# Patient Record
Sex: Female | Born: 1960 | Race: White | Hispanic: No | Marital: Married | Smoking: Never smoker
Health system: Southern US, Community
[De-identification: ages and names within clinical notes are randomized; demographics above are authoritative.]

## PROBLEM LIST (undated history)

## (undated) DIAGNOSIS — K5790 Diverticulosis of intestine, part unspecified, without perforation or abscess without bleeding: Secondary | ICD-10-CM

## (undated) DIAGNOSIS — A389 Scarlet fever, uncomplicated: Secondary | ICD-10-CM

## (undated) DIAGNOSIS — R5383 Other fatigue: Secondary | ICD-10-CM

## (undated) DIAGNOSIS — F419 Anxiety disorder, unspecified: Secondary | ICD-10-CM

## (undated) DIAGNOSIS — I10 Essential (primary) hypertension: Secondary | ICD-10-CM

## (undated) DIAGNOSIS — E669 Obesity, unspecified: Secondary | ICD-10-CM

## (undated) DIAGNOSIS — H409 Unspecified glaucoma: Secondary | ICD-10-CM

## (undated) DIAGNOSIS — G039 Meningitis, unspecified: Secondary | ICD-10-CM

## (undated) DIAGNOSIS — G43909 Migraine, unspecified, not intractable, without status migrainosus: Secondary | ICD-10-CM

## (undated) DIAGNOSIS — N200 Calculus of kidney: Secondary | ICD-10-CM

## (undated) DIAGNOSIS — N390 Urinary tract infection, site not specified: Secondary | ICD-10-CM

## (undated) DIAGNOSIS — K5792 Diverticulitis of intestine, part unspecified, without perforation or abscess without bleeding: Secondary | ICD-10-CM

## (undated) DIAGNOSIS — F329 Major depressive disorder, single episode, unspecified: Secondary | ICD-10-CM

## (undated) DIAGNOSIS — F32A Depression, unspecified: Secondary | ICD-10-CM

## (undated) HISTORY — PX: TONSILLECTOMY: SUR1361

## (undated) HISTORY — PX: ABDOMINAL HYSTERECTOMY: SHX81

## (undated) HISTORY — DX: Obesity, unspecified: E66.9

## (undated) HISTORY — DX: Migraine, unspecified, not intractable, without status migrainosus: G43.909

## (undated) HISTORY — DX: Diverticulitis of intestine, part unspecified, without perforation or abscess without bleeding: K57.92

## (undated) HISTORY — DX: Other fatigue: R53.83

## (undated) HISTORY — DX: Calculus of kidney: N20.0

## (undated) HISTORY — DX: Diverticulosis of intestine, part unspecified, without perforation or abscess without bleeding: K57.90

## (undated) HISTORY — DX: Urinary tract infection, site not specified: N39.0

## (undated) HISTORY — DX: Scarlet fever, uncomplicated: A38.9

## (undated) HISTORY — DX: Depression, unspecified: F32.A

## (undated) HISTORY — DX: Anxiety disorder, unspecified: F41.9

## (undated) HISTORY — DX: Meningitis, unspecified: G03.9

## (undated) HISTORY — DX: Unspecified glaucoma: H40.9

## (undated) HISTORY — PX: EYE SURGERY: SHX253

---
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
## 2018-04-22 ENCOUNTER — Emergency Department (HOSPITAL_COMMUNITY)
Admission: EM | Admit: 2018-04-22 | Discharge: 2018-04-22 | Disposition: A | Discharge status: Home / Self Care | Payer: No Typology Code available for payment source | Attending: Emergency Medicine | Admitting: Emergency Medicine

## 2018-04-22 ENCOUNTER — Other Ambulatory Visit: Payer: Self-pay

## 2018-04-22 ENCOUNTER — Encounter (HOSPITAL_COMMUNITY): Payer: Self-pay | Admitting: Emergency Medicine

## 2018-04-22 ENCOUNTER — Emergency Department (HOSPITAL_COMMUNITY): Payer: No Typology Code available for payment source

## 2018-04-22 DIAGNOSIS — I10 Essential (primary) hypertension: Secondary | ICD-10-CM | POA: Diagnosis not present

## 2018-04-22 DIAGNOSIS — Y92410 Unspecified street and highway as the place of occurrence of the external cause: Secondary | ICD-10-CM | POA: Insufficient documentation

## 2018-04-22 DIAGNOSIS — Y939 Activity, unspecified: Secondary | ICD-10-CM | POA: Diagnosis not present

## 2018-04-22 DIAGNOSIS — R079 Chest pain, unspecified: Secondary | ICD-10-CM | POA: Insufficient documentation

## 2018-04-22 DIAGNOSIS — R1012 Left upper quadrant pain: Secondary | ICD-10-CM | POA: Diagnosis not present

## 2018-04-22 DIAGNOSIS — Y999 Unspecified external cause status: Secondary | ICD-10-CM | POA: Insufficient documentation

## 2018-04-22 HISTORY — DX: Essential (primary) hypertension: I10

## 2018-04-22 LAB — I-STAT CREATININE, ED: Creatinine, Ser: 0.8 mg/dL (ref 0.44–1.00)

## 2018-04-22 MED ORDER — ONDANSETRON 4 MG PO TBDP: 4.0000 mg | ORAL_TABLET | Freq: Three times a day (TID) | ORAL | 0 refills | Status: DC | PRN

## 2018-04-22 MED ORDER — ONDANSETRON 4 MG PO TBDP
4.0000 mg | ORAL_TABLET | Freq: Once | ORAL | Status: AC
  Administered 2018-04-22: 4 mg via ORAL
  Filled 2018-04-22: qty 1

## 2018-04-22 MED ORDER — ONDANSETRON HCL 4 MG/2ML IJ SOLN
4.0000 mg | Freq: Once | INTRAMUSCULAR | Status: AC
  Administered 2018-04-22: 4 mg via INTRAVENOUS
  Filled 2018-04-22: qty 2

## 2018-04-22 MED ORDER — MORPHINE SULFATE (PF) 4 MG/ML IV SOLN
4.0000 mg | Freq: Once | INTRAVENOUS | Status: AC
  Administered 2018-04-22: 4 mg via INTRAVENOUS
  Filled 2018-04-22: qty 1

## 2018-04-22 MED ORDER — METHOCARBAMOL 500 MG PO TABS: 500.0000 mg | ORAL_TABLET | Freq: Two times a day (BID) | ORAL | 0 refills | Status: DC | PRN

## 2018-04-22 MED ORDER — IOHEXOL 300 MG/ML  SOLN
100.0000 mL | Freq: Once | INTRAMUSCULAR | Status: AC | PRN
  Administered 2018-04-22: 100 mL via INTRAVENOUS
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
## 2018-04-22 NOTE — ED Notes (Signed)
Pt transported to CT ?
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
## 2018-11-20 ENCOUNTER — Encounter: Payer: Self-pay | Admitting: Gastroenterology

## 2018-12-02 ENCOUNTER — Encounter: Payer: Self-pay | Admitting: Gastroenterology

## 2018-12-14 ENCOUNTER — Other Ambulatory Visit: Payer: Self-pay

## 2018-12-14 ENCOUNTER — Encounter: Payer: Self-pay | Admitting: Gastroenterology

## 2018-12-14 ENCOUNTER — Ambulatory Visit (INDEPENDENT_AMBULATORY_CARE_PROVIDER_SITE_OTHER): Payer: BC Managed Care – PPO | Admitting: Gastroenterology

## 2018-12-14 VITALS — BP 126/88 | HR 81 | Temp 98.2°F | Ht 66.0 in | Wt 228.1 lb

## 2018-12-14 DIAGNOSIS — Z1211 Encounter for screening for malignant neoplasm of colon: Secondary | ICD-10-CM | POA: Diagnosis not present

## 2018-12-14 DIAGNOSIS — Z8719 Personal history of other diseases of the digestive system: Secondary | ICD-10-CM

## 2018-12-14 NOTE — Patient Instructions (Signed)
If you are age 57 or older, your body mass index should be between 23-30. Your Body mass index is 36.82 kg/m. If this is out of the aforementioned range listed, please consider follow up with your Primary Care Provider.  If you are age 32 or younger, your body mass index should be between 19-25. Your Body mass index is 36.82 kg/m. If this is out of the aformentioned range listed, please consider follow up with your Primary Care Provider.   You have been scheduled for a colonoscopy. Please follow written instructions given to you at your visit today.  Please pick up your prep supplies at the pharmacy within the next 1-3 days. If you use inhalers (even only as needed), please bring them with you on the day of your procedure. Your physician has requested that you go to www.startemmi.com and enter the access code given to you at your visit today. This web site gives a general overview about your procedure. However, you should still follow specific instructions given to you by our office regarding your preparation for the procedure.  Thank you,  Dr. Jackquline Denmark

## 2018-12-14 NOTE — Progress Notes (Signed)
Chief Complaint: for colon  Referring Provider:  [facility] *      ASSESSMENT AND PLAN;   #1. Colorectal cancer screening  #2. H/O Diverticulitis 12/2018  Plan: - Proceed with colonoscopy in 6-8 weeks. Discussed risks & benefits. (Risks including rare perforation req laparotomy, bleeding after biopsies/polypectomy req blood transfusion, rare chance of missing neoplasms, risks of anesthesia/sedation). Benefits outweigh the risks. Patient agrees to proceed. All the questions were answered. Consent forms given for review. - Increase water intake. - Avoid NSAIDs.    HPI:    Regina Holland is a 58 y.o. female   Had 2 episodes of acute diverticulitis (dx clinically, Dr Jannette Fogo), treated with Cipro and Flagyl. Last antibiotic use was last week.  No CT performed as she got better.  For follow-up visit.  Feels much better.  No further abdominal pain.  Does eat yogurt.  No nausea, vomiting, heartburn (rare TUMs), regurgitation, odynophagia or dysphagia.  No significant diarrhea or constipation.  There is no melena or hematochezia. No unintentional weight loss.  2/day softer BMs -baseline  Getting physical on friday Past Medical History:  Diagnosis Date  . Anxiety   . Depression   . Diverticulitis   . Diverticulosis   . Fatigue   . Glaucoma   . Hypertension   . Kidney stone   . Meningitis spinal   . Migraines   . Obesity   . Scarlet fever   . UTI (urinary tract infection)     Past Surgical History:  Procedure Laterality Date  . ABDOMINAL HYSTERECTOMY    . EYE SURGERY    . TONSILLECTOMY      Family History  Problem Relation Age of Onset  . Barrett's esophagus Mother   . Diabetes Sister   . Colon cancer Neg Hx   . Esophageal cancer Neg Hx     Social History   Tobacco Use  . Smoking status: Never Smoker  . Smokeless tobacco: Never Used  Substance Use Topics  . Alcohol use: Never    Frequency: Never  . Drug use: Never    Current Outpatient  Medications  Medication Sig Dispense Refill  . Acetaminophen (TYLENOL PO) Take 1 tablet by mouth as needed.    . Cholecalciferol (VITAMIN D3 PO) Take 1 tablet by mouth daily.    Marland Kitchen CINNAMON PO Take 1 tablet by mouth daily.    . diphenhydrAMINE HCl (BENADRYL PO) Take 1 tablet by mouth at bedtime.    Marland Kitchen lisinopril (ZESTRIL) 5 MG tablet Take 5 mg by mouth daily.     No current facility-administered medications for this visit.     Allergies  Allergen Reactions  . Aspirin     Review of Systems:  Constitutional: Denies fever, chills, diaphoresis, appetite change and fatigue.  HEENT: Denies photophobia, eye pain, redness, hearing loss, ear pain, congestion, sore throat, rhinorrhea, sneezing, mouth sores, neck pain, neck stiffness and tinnitus.   Respiratory: Denies SOB, DOE, cough, chest tightness,  and wheezing.   Cardiovascular: Denies chest pain, palpitations and leg swelling.  Genitourinary: Has urinary problems. Musculoskeletal: Denies myalgias, back pain, joint swelling, arthralgias and gait problem.  Skin: No rash.  Neurological: Denies dizziness, seizures, syncope, weakness, light-headedness, numbness and headaches.  Hematological: Denies adenopathy. Easy bruising, personal or family bleeding history  Psychiatric/Behavioral: No anxiety or depression     Physical Exam:    BP 126/88   Pulse 81   Temp 98.2 F (36.8 C)   Ht 5\' 6"  (1.676 m)  Wt 228 lb 2 oz (103.5 kg)   BMI 36.82 kg/m  Filed Weights   12/14/18 0841  Weight: 228 lb 2 oz (103.5 kg)   Constitutional:  Well-developed, in no acute distress. Psychiatric: Normal mood and affect. Behavior is normal. HEENT: Pupils normal.  Conjunctivae are normal. No scleral icterus. Neck supple.  Cardiovascular: Normal rate, regular rhythm. No edema Pulmonary/chest: Effort normal and breath sounds normal. No wheezing, rales or rhonchi. Abdominal: Soft, nondistended. Nontender. Bowel sounds active throughout. There are no masses  palpable. No hepatomegaly. Rectal:  defered Neurological: Alert and oriented to person place and time. Skin: Skin is warm and dry. No rashes noted.  Data Reviewed: I have personally reviewed following labs and imaging studies  CBC: No flowsheet data found.  CMP: CMP Latest Ref Rng & Units 04/22/2018  Creatinine 0.44 - 1.00 mg/dL 0.80     Carmell Austria, MD 12/14/2018, 8:57 AM  Cc: [facility] *

## 2019-01-12 ENCOUNTER — Encounter: Payer: BC Managed Care – PPO | Admitting: Gastroenterology

## 2019-01-14 ENCOUNTER — Encounter: Payer: Self-pay | Admitting: Gastroenterology

## 2019-01-21 ENCOUNTER — Telehealth: Payer: Self-pay

## 2019-01-21 NOTE — Telephone Encounter (Signed)
Covid-19 screening questions   Do you now or have you had a fever in the last 14 days?  Do you have any respiratory symptoms of shortness of breath or cough now or in the last 14 days?  Do you have any family members or close contacts with diagnosed or suspected Covid-19 in the past 14 days?  Have you been tested for Covid-19 and found to be positive?       

## 2019-01-22 ENCOUNTER — Encounter: Payer: Self-pay | Admitting: Gastroenterology

## 2019-01-22 ENCOUNTER — Other Ambulatory Visit: Payer: Self-pay

## 2019-01-22 ENCOUNTER — Ambulatory Visit (AMBULATORY_SURGERY_CENTER): Payer: BC Managed Care – PPO | Admitting: Gastroenterology

## 2019-01-22 VITALS — BP 124/66 | HR 64 | Temp 98.3°F | Resp 12 | Ht 66.0 in | Wt 228.0 lb

## 2019-01-22 DIAGNOSIS — Z1211 Encounter for screening for malignant neoplasm of colon: Secondary | ICD-10-CM | POA: Diagnosis present

## 2019-01-22 DIAGNOSIS — D120 Benign neoplasm of cecum: Secondary | ICD-10-CM

## 2019-01-22 MED ORDER — SODIUM CHLORIDE 0.9 % IV SOLN: 500.0000 mL | Freq: Once | INTRAVENOUS | Status: DC

## 2019-01-22 NOTE — Patient Instructions (Signed)
You had one polyp. Read all of the handouts given to you by your recovery room nurse.  Thank-you for choosing [place] for your healthcare needs today.  YOU HAD AN ENDOSCOPIC PROCEDURE TODAY AT [facility]:   Refer to the procedure report that was given to you for any specific questions about what was found during the examination.  If the procedure report does not answer your questions, please call your gastroenterologist to clarify.  If you requested that your care partner not be given the details of your procedure findings, then the procedure report has been included in a sealed envelope for you to review at your convenience later.  YOU SHOULD EXPECT: Some feelings of bloating in the abdomen. Passage of more gas than usual.  Walking can help get rid of the air that was put into your GI tract during the procedure and reduce the bloating. If you had a lower endoscopy (such as a colonoscopy or flexible sigmoidoscopy) you may notice spotting of blood in your stool or on the toilet paper. If you underwent a bowel prep for your procedure, you may not have a normal bowel movement for a few days.  Please Note:  You might notice some irritation and congestion in your nose or some drainage.  This is from the oxygen used during your procedure.  There is no need for concern and it should clear up in a day or so.  SYMPTOMS TO REPORT IMMEDIATELY:   Following lower endoscopy (colonoscopy or flexible sigmoidoscopy):  Excessive amounts of blood in the stool  Significant tenderness or worsening of abdominal pains  Swelling of the abdomen that is new, acute  Fever of 100F or higher   For urgent or emergent issues, a gastroenterologist can be reached at any hour by calling [phone].   DIET:  We do recommend a small meal at first, but then you may proceed to your regular diet.  Drink plenty of fluids but you should avoid alcoholic beverages for 24 hours.  Try to increase the fiber in your diet,  and drink plenty of water.  ACTIVITY:  You should plan to take it easy for the rest of today and you should NOT DRIVE or use heavy machinery until tomorrow (because of the sedation medicines used during the test).    FOLLOW UP: Our staff will call the number listed on your records 48-72 hours following your procedure to check on you and address any questions or concerns that you may have regarding the information given to you following your procedure. If we do not reach you, we will leave a message.  We will attempt to reach you two times.  During this call, we will ask if you have developed any symptoms of COVID 19. If you develop any symptoms (ie: fever, flu-like symptoms, shortness of breath, cough etc.) before then, please call [phone].  If you test positive for Covid 19 in the 2 weeks post procedure, please call and report this information to [place].    If any biopsies were taken you will be contacted by phone or by letter within the next 1-3 weeks.  Please call us at [phone] if you have not heard about the biopsies in 3 weeks.    SIGNATURES/CONFIDENTIALITY: You and/or your care partner have signed paperwork which will be entered into your electronic medical record.  These signatures attest to the fact that that the information above on your After Visit Summary has been reviewed and is understood.  Full responsibility of the confidentiality of this discharge information lies with you and/or your care-partner.

## 2019-01-22 NOTE — Op Note (Signed)
[place] Patient Name: Regina Holland Procedure Date: 01/22/2019 8:09 AM MRN: [id number] Endoscopist: Jackquline Denmark , MD Age: 58 Referring MD:  Date of Birth: [birth year] Gender: Female Account #: [id number] Procedure:                Colonoscopy Indications:              Screening for colorectal malignant neoplasm Medicines:                Monitored Anesthesia Care Procedure:                Pre-Anesthesia Assessment:                           - Prior to the procedure, a History and Physical                            was performed, and patient medications and                            allergies were reviewed. The patient's tolerance of                            previous anesthesia was also reviewed. The risks                            and benefits of the procedure and the sedation                            options and risks were discussed with the patient.                            All questions were answered, and informed consent                            was obtained. Prior Anticoagulants: The patient has                            taken no previous anticoagulant or antiplatelet                            agents. ASA Grade Assessment: II - A patient with                            mild systemic disease. After reviewing the risks                            and benefits, the patient was deemed in                            satisfactory condition to undergo the procedure.                           After obtaining informed consent, the colonoscope  was passed under direct vision. Throughout the                            procedure, the patient's blood pressure, pulse, and                            oxygen saturations were monitored continuously. The                            Colonoscope was introduced through the anus and                            advanced to the 2 cm into the ileum. The                            colonoscopy was performed  without difficulty. The                            patient tolerated the procedure well. The quality                            of the bowel preparation was good. The terminal                            ileum, ileocecal valve, appendiceal orifice, and                            rectum were photographed. Scope In: 8:13:31 AM Scope Out: 8:24:18 AM Scope Withdrawal Time: 0 hours 7 minutes 38 seconds  Total Procedure Duration: 0 hours 10 minutes 47 seconds  Findings:                 A 6 mm polyp was found in the cecum. The polyp was                            sessile. The polyp was removed with a cold snare.                            Resection and retrieval were complete.                           Multiple medium-mouthed diverticula were found in                            the sigmoid colon, few in descending colon and                            ascending colon.                           Non-bleeding internal hemorrhoids were found during                            retroflexion. The hemorrhoids were small.  The terminal ileum appeared normal.                           The exam was otherwise without abnormality on                            direct and retroflexion views. Complications:            No immediate complications. Estimated Blood Loss:     Estimated blood loss: none. Impression:               -Colonic polyp s/p polypectomy.                           -Pancolonic diverticulosis predominantly in the                            sigmoid colon.                           -Minimal internal hemorrhoids.                           -Otherwise normal colonoscopy to TI. Recommendation:           - Patient has a contact number available for                            emergencies. The signs and symptoms of potential                            delayed complications were discussed with the                            patient. Return to normal activities tomorrow.                             Written discharge instructions were provided to the                            patient.                           - High fiber diet.                           - Continue present medications.                           - Await pathology results.                           - Repeat colonoscopy for surveillance based on                            pathology results.                           - Return to GI office PRN.  D/W Risa Grill, MD 01/22/2019 8:30:31 AM This report has been signed electronically.

## 2019-01-22 NOTE — Progress Notes (Signed)
Called to room to assist during endoscopic procedure.  Patient ID and intended procedure confirmed with present staff. Received instructions for my participation in the procedure from the performing physician.  

## 2019-01-26 ENCOUNTER — Telehealth: Payer: Self-pay | Admitting: *Deleted

## 2019-01-26 NOTE — Telephone Encounter (Signed)
  Follow up Call-  Call back number 01/22/2019  Post procedure Call Back phone  # [phone] phone ok to speak to him.  Permission to leave phone message Yes     Patient questions:  Do you have a fever, pain , or abdominal swelling? No. Pain Score  0 *  Have you tolerated food without any problems? Yes.    Have you been able to return to your normal activities? Yes.    Do you have any questions about your discharge instructions: Diet   No. Medications  No. Follow up visit  No.  Do you have questions or concerns about your Care? No.  Actions: * If pain score is 4 or above: No action needed, pain <4  1. Have you developed a fever since your procedure? no  2.   Have you had an respiratory symptoms (SOB or cough) since your procedure? no  3.   Have you tested positive for COVID 19 since your procedure no  4.   Have you had any family members/close contacts diagnosed with the COVID 19 since your procedure? no   If yes to any of these questions please route to Joylene John, RN and Alphonsa Gin, Therapist, sports.

## 2019-01-26 NOTE — Telephone Encounter (Signed)
  Follow up Call-  Call back number 01/22/2019  Post procedure Call Back phone  # [phone] phone ok to speak to him.  Permission to leave phone message Yes     Patient questions:  Message left to call us if necessary.

## 2019-01-31 ENCOUNTER — Encounter: Payer: Self-pay | Admitting: Gastroenterology
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
## 2022-09-17 ENCOUNTER — Other Ambulatory Visit: Payer: Self-pay | Admitting: Physician Assistant

## 2022-09-17 DIAGNOSIS — M545 Low back pain, unspecified: Secondary | ICD-10-CM

## 2022-09-23 ENCOUNTER — Encounter: Payer: Self-pay | Admitting: Physician Assistant

## 2022-09-25 ENCOUNTER — Ambulatory Visit
Admission: RE | Admit: 2022-09-25 | Discharge: 2022-09-25 | Disposition: A | Discharge status: Home / Self Care | Payer: BC Managed Care – PPO | Source: Ambulatory Visit | Attending: Physician Assistant | Admitting: Physician Assistant

## 2022-09-25 DIAGNOSIS — M545 Low back pain, unspecified: Secondary | ICD-10-CM

## 2022-09-26 ENCOUNTER — Other Ambulatory Visit: Payer: BC Managed Care – PPO

## 2022-12-19 ENCOUNTER — Other Ambulatory Visit: Payer: Self-pay | Admitting: Family Medicine

## 2022-12-19 DIAGNOSIS — N200 Calculus of kidney: Secondary | ICD-10-CM

## 2022-12-19 DIAGNOSIS — R310 Gross hematuria: Secondary | ICD-10-CM

## 2022-12-19 DIAGNOSIS — N390 Urinary tract infection, site not specified: Secondary | ICD-10-CM

## 2022-12-19 DIAGNOSIS — Z87448 Personal history of other diseases of urinary system: Secondary | ICD-10-CM

## 2022-12-19 DIAGNOSIS — R93429 Abnormal radiologic findings on diagnostic imaging of unspecified kidney: Secondary | ICD-10-CM

## 2023-02-01 ENCOUNTER — Other Ambulatory Visit: Payer: BC Managed Care – PPO

## 2023-02-07 ENCOUNTER — Encounter: Payer: Self-pay | Admitting: Family Medicine

## 2023-02-10 ENCOUNTER — Ambulatory Visit
Admission: RE | Admit: 2023-02-10 | Discharge: 2023-02-10 | Disposition: A | Discharge status: Home / Self Care | Payer: BC Managed Care – PPO | Source: Ambulatory Visit | Attending: Family Medicine | Admitting: Family Medicine

## 2023-02-10 ENCOUNTER — Other Ambulatory Visit: Payer: Self-pay | Admitting: Family Medicine

## 2023-02-10 ENCOUNTER — Inpatient Hospital Stay
Admission: RE | Admit: 2023-02-10 | Discharge: 2023-02-10 | Discharge status: Home / Self Care | Payer: BC Managed Care – PPO | Source: Ambulatory Visit | Attending: Family Medicine | Admitting: Family Medicine

## 2023-02-10 DIAGNOSIS — R93429 Abnormal radiologic findings on diagnostic imaging of unspecified kidney: Secondary | ICD-10-CM

## 2023-02-10 DIAGNOSIS — R310 Gross hematuria: Secondary | ICD-10-CM

## 2023-02-10 DIAGNOSIS — N200 Calculus of kidney: Secondary | ICD-10-CM

## 2023-02-10 DIAGNOSIS — N390 Urinary tract infection, site not specified: Secondary | ICD-10-CM

## 2023-02-10 DIAGNOSIS — Z87448 Personal history of other diseases of urinary system: Secondary | ICD-10-CM

## 2023-02-10 MED ORDER — GADOPICLENOL 0.5 MMOL/ML IV SOLN
10.0000 mL | Freq: Once | INTRAVENOUS | Status: AC | PRN
  Administered 2023-02-10: 10 mL via INTRAVENOUS
